# Patient Record
Sex: Male | Born: 1955 | Race: White | Hispanic: No | Marital: Single
Health system: Southern US, Community
[De-identification: ages and names within clinical notes are randomized; demographics above are authoritative.]

---

## 2004-10-19 ENCOUNTER — Inpatient Hospital Stay: Payer: Self-pay | Admitting: Internal Medicine

## 2004-11-16 ENCOUNTER — Encounter: Payer: Self-pay | Admitting: Internal Medicine

## 2004-12-07 ENCOUNTER — Encounter: Payer: Self-pay | Admitting: Internal Medicine

## 2005-01-07 ENCOUNTER — Encounter: Payer: Self-pay | Admitting: Internal Medicine

## 2007-01-02 ENCOUNTER — Other Ambulatory Visit: Payer: Self-pay

## 2007-01-02 ENCOUNTER — Emergency Department: Payer: Self-pay | Admitting: Emergency Medicine

## 2007-01-04 ENCOUNTER — Ambulatory Visit: Payer: Self-pay | Admitting: Internal Medicine

## 2008-11-02 ENCOUNTER — Emergency Department: Payer: Self-pay | Admitting: Emergency Medicine

## 2008-11-20 ENCOUNTER — Ambulatory Visit: Payer: Self-pay | Admitting: Internal Medicine

## 2009-02-22 ENCOUNTER — Ambulatory Visit: Payer: Self-pay | Admitting: Internal Medicine

## 2010-07-22 IMAGING — CR DG CHEST 1V PORT
1 series · 1 of 1 positions shown · non-contrast
Comparison: none

REASON FOR EXAM: Chest Pain
COMMENTS:

PROCEDURE:     DXR - DXR PORTABLE CHEST SINGLE VIEW  - November 02, 2008  [DATE]
RESULT:     Comparison: 01/02/2007

[view not recorded]
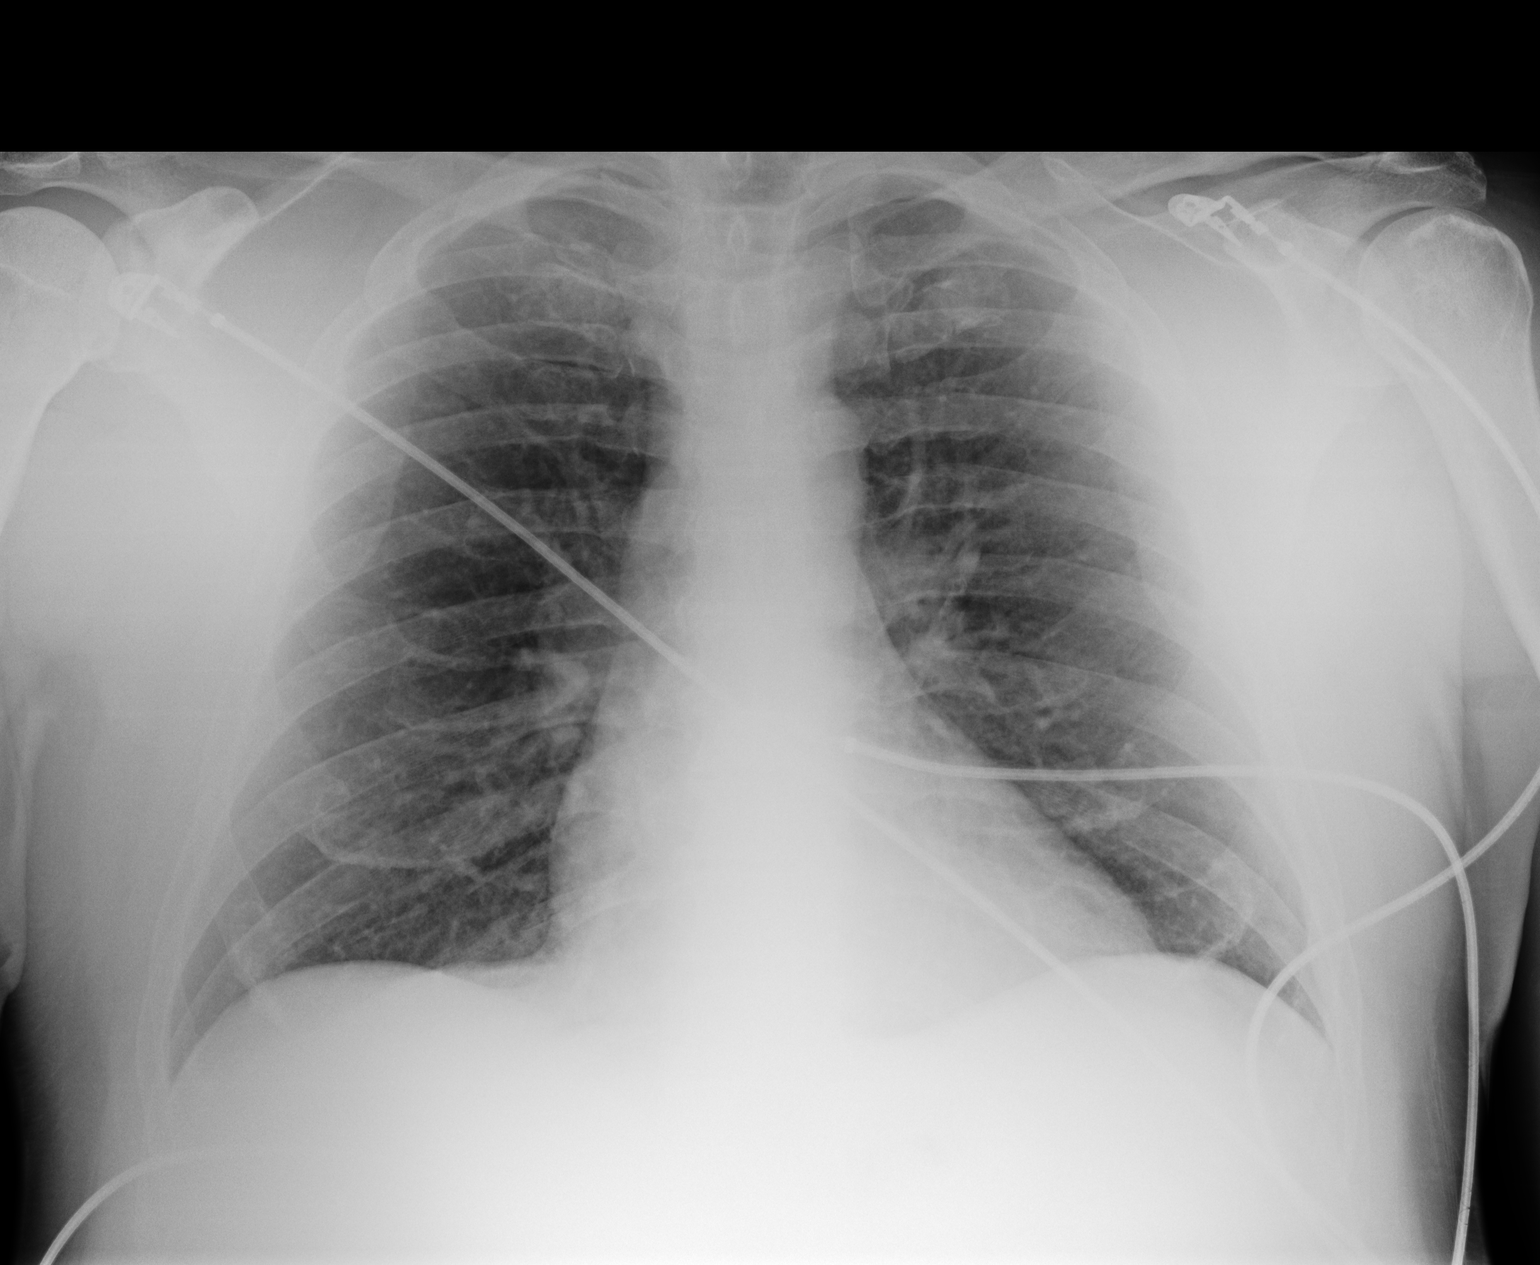

[1 of 1 positions shown; findings below may reference images not displayed]

FINDINGS: Single portable AP chest radiograph is provided. There is no focal
parenchymal opacity, pleural effusion, or pneumothorax. Normal
cardiomediastinal silhouette. There is posttraumatic deformity of the left
mid clavicle.
IMPRESSION: No acute disease of the chest.

## 2012-10-04 ENCOUNTER — Ambulatory Visit: Payer: Self-pay | Admitting: Unknown Physician Specialty

## 2012-10-07 LAB — PATHOLOGY REPORT

## 2012-11-16 DIAGNOSIS — I313 Pericardial effusion (noninflammatory): Secondary | ICD-10-CM | POA: Insufficient documentation

## 2014-01-02 ENCOUNTER — Ambulatory Visit: Payer: Self-pay | Admitting: Internal Medicine

## 2017-12-21 ENCOUNTER — Ambulatory Visit (INDEPENDENT_AMBULATORY_CARE_PROVIDER_SITE_OTHER): Payer: No Typology Code available for payment source | Admitting: Podiatry

## 2017-12-21 DIAGNOSIS — B351 Tinea unguium: Secondary | ICD-10-CM

## 2017-12-21 DIAGNOSIS — I10 Essential (primary) hypertension: Secondary | ICD-10-CM | POA: Insufficient documentation

## 2017-12-21 DIAGNOSIS — E785 Hyperlipidemia, unspecified: Secondary | ICD-10-CM | POA: Insufficient documentation

## 2017-12-21 DIAGNOSIS — B353 Tinea pedis: Secondary | ICD-10-CM | POA: Diagnosis not present

## 2017-12-21 DIAGNOSIS — I2510 Atherosclerotic heart disease of native coronary artery without angina pectoris: Secondary | ICD-10-CM | POA: Insufficient documentation

## 2017-12-21 MED ORDER — TERBINAFINE HCL 250 MG PO TABS: 250.0000 mg | ORAL_TABLET | Freq: Every day | ORAL | 0 refills | Status: AC

## 2017-12-21 MED ORDER — CLOTRIMAZOLE-BETAMETHASONE 1-0.05 % EX CREA: 1.0000 "application " | TOPICAL_CREAM | Freq: Two times a day (BID) | CUTANEOUS | 2 refills | Status: AC

## 2017-12-21 NOTE — Progress Notes (Signed)
   HPI: [id number] year old male presents the office today for evaluation of painful calluses to the bilateral bottoms of his feet reticulate on the heels.  He has had this for approximately 4 years now.  Patient states that he was in the [employer] in the [employer] when [place] was [place] and he caught a fungus on his feet when overseas.  He uses a scalpel and a blade to cut the calluses down with minimal alleviation of his symptoms.  He states that at one point a another podiatrist recommended Vaseline under occlusion daily at nighttime and he says that that did help to alleviate some of his symptoms.  He presents today for further treatment evaluation  No past medical history on file.   Physical Exam: General: The patient is alert and oriented x3 in no acute distress.  Dermatology: Hyperkeratotic skin is noted throughout the heels and other areas of the weightbearing surfaces of the feet.  Occasional pruritus.  No open lesions noted.  There are some central hyperkeratotic cores consistent with multiple porokeratosis noted diffusely throughout the weightbearing surfaces of the feet. Hyperkeratotic, thickened, dystrophic nails also noted 1-5 bilateral consistent with onychomycosis  Vascular: Palpable pedal pulses bilaterally. No edema or erythema noted. Capillary refill within normal limits.  Neurological: Epicritic and protective threshold grossly intact bilaterally.   Musculoskeletal Exam: Range of motion within normal limits to all pedal and ankle joints bilateral. Muscle strength 5/5 in all groups bilateral.   Assessment: -Tinea pedis with hyperkeratosis bilateral feet -Onychomycosis bilateral   Plan of Care:  - Patient evaluated.   -  I explained to the patient that in order to alleviate symptoms we should rule out and eliminate any fungal elements contributory to the hyperkeratosis.  Patient understands. -Prescription for Lamisil 250 mg #90.  The patient [facility] which [facility] states was within normal limits.  He states that the test was within the past 30 days. -Prescription for Lotrisone cream -Return to clinic in 3 months   Felecia ShellingBrent M. Jeanette Moffatt, DPM [facility]  Dr. Felecia ShellingBrent M. Jahson Emanuele, DPM    [address]                                        [place], [place] [id number]                Office [phone]  Fax [phone]
